# Patient Record
Sex: Male | Born: 2000 | Race: Black or African American | Hispanic: No | Marital: Single | Smoking: Current every day smoker
Health system: Southern US, Community
[De-identification: ages and names within clinical notes are randomized; demographics above are authoritative.]

## PROBLEM LIST (undated history)

## (undated) ENCOUNTER — Emergency Department (HOSPITAL_COMMUNITY): Admission: EM | Payer: Medicaid Other | Source: Home / Self Care

## (undated) DIAGNOSIS — E119 Type 2 diabetes mellitus without complications: Secondary | ICD-10-CM

## (undated) DIAGNOSIS — R569 Unspecified convulsions: Secondary | ICD-10-CM

## (undated) HISTORY — PX: MANDIBLE FRACTURE SURGERY: SHX706

---
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
## 2018-04-17 ENCOUNTER — Encounter (HOSPITAL_COMMUNITY): Payer: Self-pay

## 2018-04-17 ENCOUNTER — Emergency Department (HOSPITAL_COMMUNITY)
Admission: EM | Admit: 2018-04-17 | Discharge: 2018-04-17 | Disposition: A | Discharge status: Home / Self Care | Payer: Medicaid Other | Attending: Emergency Medicine | Admitting: Emergency Medicine

## 2018-04-17 DIAGNOSIS — F17200 Nicotine dependence, unspecified, uncomplicated: Secondary | ICD-10-CM | POA: Insufficient documentation

## 2018-04-17 DIAGNOSIS — Z711 Person with feared health complaint in whom no diagnosis is made: Secondary | ICD-10-CM | POA: Insufficient documentation

## 2018-04-17 NOTE — ED Triage Notes (Signed)
Pt presents for STD check, reports peeling of penis that was 1st noted yesterday.  Pt denies any penile drainage, reports 3-4 partners with recent unprotected sex.

## 2018-04-17 NOTE — ED Provider Notes (Signed)
[facility] EMERGENCY DEPARTMENT Provider Note   CSN: [id number] Arrival date & time: 04/17/18  1417    History   Chief Complaint No chief complaint on file.   HPI Todd Frost is a 18 y.o. male.     HPI Patient is a 18 year old male presents the emergency department with an area of irritation on the shaft of his penis and wants to be checked for STDs.  No penile discharge.  He has had several partners with unprotected sex.  No other complaints at this time.   History reviewed. No pertinent past medical history.  Patient Active Problem List   Diagnosis Date Noted  . Learning disability 04/21/2011  . Delayed milestones 04/21/2011  . 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 No pertinent surgical history.      Home Medications    Prior to Admission medications   Medication Sig Start Date End Date Taking? Authorizing Provider  ibuprofen (ADVIL,MOTRIN) 600 MG tablet Take 1 tablet (600 mg total) by mouth every 6 (six) hours as needed. 12/15/17   Maczis, Elmer Sow, PA-C    Family History History reviewed. No pertinent family history.  Social History Social History   Tobacco Use  . Smoking status: Current Every Day Smoker  . Smokeless tobacco: Never Used  Substance Use Topics  . Alcohol use: No  . Drug use: Not Currently    Types: Marijuana    Comment: sometimes, last used today     Allergies   Patient has no known allergies.   Review of Systems Review of Systems  All other systems reviewed and are negative.    Physical Exam Updated Vital Signs BP 123/71 (BP Location: Right Arm)   Pulse (!) 57   Temp 98.2 F (36.8 C) (Oral)   Resp 16   Ht 6\' 3"  (1.905 m)   Wt 68.5 kg   SpO2 100%   BMI 18.87 kg/m   Physical Exam Vitals signs and nursing note reviewed.  Constitutional:      Appearance: He is well-developed.  HENT:     Head: Normocephalic.  Neck:     Musculoskeletal: Normal range of motion.  Pulmonary:     Effort:  Pulmonary effort is normal.  Abdominal:     General: There is no distension.  Genitourinary:    Comments: Circumcised penis.  Healed abrasions to the shaft of his penis without obvious STD appearance Musculoskeletal: Normal range of motion.  Neurological:     Mental Status: He is alert and oriented to person, place, and time.      ED Treatments / Results  Labs (all labs ordered are listed, but only abnormal results are displayed) Labs Reviewed - No data to display  EKG None  Radiology No results found.  Procedures Procedures (including critical care time)  Medications Ordered in ED Medications - No data to display   Initial Impression / Assessment and Plan / ED Course  I have reviewed the triage vital signs and the nursing notes.  Pertinent labs & imaging results that were available during my care of the patient were reviewed by me and considered in my medical decision making (see chart for details).       Suspect abrasion of the shaft of his penis from intercourse and or masturbation.  No indication for additional testing here in the emergency department.  He will need a full STD panel and evaluation at the [facility] department.  He is been given contact information for  them.   Final Clinical Impressions(s) / ED Diagnoses   Final diagnoses:  Concern about STD in male without diagnosis    ED Discharge Orders    None       Azalia Bilis, MD 04/17/18 [phone]

## 2018-04-17 NOTE — ED Notes (Signed)
Patient verbalizes understanding of discharge instructions. Opportunity for questioning and answers were provided. Armband removed by staff, pt discharged from ED home via POV.  

## 2018-04-17 NOTE — ED Notes (Signed)
Pt here for an STD check. Pt reports a scab on his penis and discharge.

## 2018-04-17 NOTE — ED Notes (Signed)
Pt was not happy that physician was not going to give him a shot like he's had before and was going to refer him to the [facility]. Pt left without paperwork and signature.

## 2018-06-01 ENCOUNTER — Other Ambulatory Visit: Payer: Self-pay

## 2018-06-01 ENCOUNTER — Emergency Department (HOSPITAL_COMMUNITY)
Admission: EM | Admit: 2018-06-01 | Discharge: 2018-06-01 | Disposition: A | Discharge status: Home / Self Care | Payer: Medicaid Other | Source: Home / Self Care | Attending: Emergency Medicine | Admitting: Emergency Medicine

## 2018-06-01 ENCOUNTER — Emergency Department (HOSPITAL_COMMUNITY): Payer: Medicaid Other

## 2018-06-01 ENCOUNTER — Encounter (HOSPITAL_COMMUNITY): Payer: Self-pay | Admitting: *Deleted

## 2018-06-01 ENCOUNTER — Emergency Department (HOSPITAL_COMMUNITY)
Admission: EM | Admit: 2018-06-01 | Discharge: 2018-06-01 | Disposition: A | Discharge status: Home / Self Care | Payer: Medicaid Other | Attending: Emergency Medicine | Admitting: Emergency Medicine

## 2018-06-01 DIAGNOSIS — Y999 Unspecified external cause status: Secondary | ICD-10-CM | POA: Insufficient documentation

## 2018-06-01 DIAGNOSIS — R4020 Unspecified coma: Secondary | ICD-10-CM | POA: Insufficient documentation

## 2018-06-01 DIAGNOSIS — F17210 Nicotine dependence, cigarettes, uncomplicated: Secondary | ICD-10-CM | POA: Insufficient documentation

## 2018-06-01 DIAGNOSIS — R569 Unspecified convulsions: Secondary | ICD-10-CM

## 2018-06-01 DIAGNOSIS — Y9389 Activity, other specified: Secondary | ICD-10-CM | POA: Insufficient documentation

## 2018-06-01 DIAGNOSIS — E119 Type 2 diabetes mellitus without complications: Secondary | ICD-10-CM | POA: Insufficient documentation

## 2018-06-01 DIAGNOSIS — Y92009 Unspecified place in unspecified non-institutional (private) residence as the place of occurrence of the external cause: Secondary | ICD-10-CM | POA: Insufficient documentation

## 2018-06-01 DIAGNOSIS — S02602A Fracture of unspecified part of body of left mandible, initial encounter for closed fracture: Secondary | ICD-10-CM | POA: Insufficient documentation

## 2018-06-01 DIAGNOSIS — S02609D Fracture of mandible, unspecified, subsequent encounter for fracture with routine healing: Secondary | ICD-10-CM

## 2018-06-01 DIAGNOSIS — W01198A Fall on same level from slipping, tripping and stumbling with subsequent striking against other object, initial encounter: Secondary | ICD-10-CM

## 2018-06-01 DIAGNOSIS — Z79899 Other long term (current) drug therapy: Secondary | ICD-10-CM

## 2018-06-01 DIAGNOSIS — R220 Localized swelling, mass and lump, head: Secondary | ICD-10-CM | POA: Diagnosis present

## 2018-06-01 HISTORY — DX: Type 2 diabetes mellitus without complications: E11.9

## 2018-06-01 HISTORY — DX: Unspecified convulsions: R56.9

## 2018-06-01 LAB — CBC WITH DIFFERENTIAL/PLATELET
Abs Immature Granulocytes: 0 K/uL (ref 0.00–0.07)
Basophils Absolute: 0 K/uL (ref 0.0–0.1)
Basophils Relative: 1 %
Eosinophils Absolute: 0.1 K/uL (ref 0.0–0.5)
Eosinophils Relative: 2 %
HCT: 43.1 % (ref 39.0–52.0)
Hemoglobin: 14.6 g/dL (ref 13.0–17.0)
Immature Granulocytes: 0 %
Lymphocytes Relative: 37 %
Lymphs Abs: 1.6 K/uL (ref 0.7–4.0)
MCH: 31.7 pg (ref 26.0–34.0)
MCHC: 33.9 g/dL (ref 30.0–36.0)
MCV: 93.5 fL (ref 80.0–100.0)
Monocytes Absolute: 0.3 K/uL (ref 0.1–1.0)
Monocytes Relative: 6 %
Neutro Abs: 2.4 K/uL (ref 1.7–7.7)
Neutrophils Relative %: 54 %
Platelets: 221 K/uL (ref 150–400)
RBC: 4.61 MIL/uL (ref 4.22–5.81)
RDW: 13.3 % (ref 11.5–15.5)
WBC: 4.4 K/uL (ref 4.0–10.5)
nRBC: 0 % (ref 0.0–0.2)

## 2018-06-01 LAB — VALPROIC ACID LEVEL: Valproic Acid Lvl: <10 ug/mL — ABNORMAL LOW (ref 50.0–100.0)

## 2018-06-01 LAB — BASIC METABOLIC PANEL
Anion gap: 9 (ref 5–15)
BUN: <5 mg/dL — ABNORMAL LOW (ref 6–20)
CO2: 22 mmol/L (ref 22–32)
Calcium: 9 mg/dL (ref 8.9–10.3)
Chloride: 103 mmol/L (ref 98–111)
Creatinine, Ser: 0.86 mg/dL (ref 0.61–1.24)
GFR calc Af Amer: >60 mL/min (ref >60)
GFR calc non Af Amer: >60 mL/min (ref >60)
Glucose, Bld: 129 mg/dL — ABNORMAL HIGH (ref 70–99)
Potassium: 3.4 mmol/L — ABNORMAL LOW (ref 3.5–5.1)
Sodium: 134 mmol/L — ABNORMAL LOW (ref 135–145)

## 2018-06-01 LAB — MAGNESIUM: Magnesium: 1.9 mg/dL (ref 1.7–2.4)

## 2018-06-01 LAB — HEPATIC FUNCTION PANEL
ALT: 23 U/L (ref 0–44)
AST: 33 U/L (ref 15–41)
Albumin: 3.8 g/dL (ref 3.5–5.0)
Alkaline Phosphatase: 111 U/L (ref 38–126)
Bilirubin, Direct: 0.2 mg/dL (ref 0.0–0.2)
Indirect Bilirubin: 0.9 mg/dL (ref 0.3–0.9)
Total Bilirubin: 1.1 mg/dL (ref 0.3–1.2)
Total Protein: 7.3 g/dL (ref 6.5–8.1)

## 2018-06-01 MED ORDER — MORPHINE SULFATE (PF) 4 MG/ML IV SOLN
4.0000 mg | Freq: Once | INTRAVENOUS | Status: AC
  Administered 2018-06-01: 05:00:00 4 mg via INTRAVENOUS
  Filled 2018-06-01: qty 1

## 2018-06-01 MED ORDER — DIVALPROEX SODIUM 125 MG PO CSDR: 125.0000 mg | DELAYED_RELEASE_CAPSULE | Freq: Four times a day (QID) | ORAL | 0 refills | Status: AC

## 2018-06-01 MED ORDER — MORPHINE SULFATE (PF) 4 MG/ML IV SOLN
4.0000 mg | Freq: Once | INTRAVENOUS | Status: AC
  Administered 2018-06-01: 4 mg via INTRAVENOUS
  Filled 2018-06-01: qty 1

## 2018-06-01 MED ORDER — KETOROLAC TROMETHAMINE 30 MG/ML IJ SOLN
15.0000 mg | Freq: Once | INTRAMUSCULAR | Status: AC
  Administered 2018-06-01: 15 mg via INTRAVENOUS
  Filled 2018-06-01: qty 1

## 2018-06-01 MED ORDER — VALPROATE SODIUM 500 MG/5ML IV SOLN
750.0000 mg | Freq: Once | INTRAVENOUS | Status: AC
  Administered 2018-06-01: 08:00:00 750 mg via INTRAVENOUS
  Filled 2018-06-01: qty 7.5
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
## 2018-06-01 NOTE — ED Provider Notes (Signed)
[facility]-EMERGENCY DEPT Provider Note   CSN: [id number] Arrival date & time: 06/01/18  0715    History   Chief Complaint Chief Complaint  Patient presents with   Seizures   Facial Injury   Dizziness    HPI Todd Frost is a 18 y.o. male.     Todd Frost is a 18 y.o. male with a history of seizures, and recent ORIF of the mandible, who presents this morning after reported seizure a little after 6 AM.  Patient was seen at [facility] emergency department overnight after patient had a syncopal episode with no witnessed seizure activity at that time where he fell and hit his jaw.  He reports that after returning home from the hospital his brother witnessed him have a seizure while he fell to the ground from sitting, hitting his jaw and head once again.  He has a known history of seizures and reports he has not been taking his Depakote, reports he can only take in liquids with his jaw wired shut and has been unable to take any pills.  Reports he last took this medication may be in January, and cannot remember the dose.  Patient unsure how long the seizure lasted but is now alert, oriented and has had no further seizure-like activity.  He is complaining of pain over his jaw, he has not taken anything for pain prior to arrival, denies any headache, vision changes, dizziness, numbness weakness or tingling.  Patient reports that he has only been taking in juice primarily for nutrition, has not been taking in much food for nutrition and reports he intermittently feels lightheaded.  Has follow-up with his oral surgeon on 4/12.  No other new or concerning symptoms, no fevers, cough, shortness of breath.  During recent ED visit this morning patient had a CT scan of the jaw which showed no new changes or fractures from fall, BMP was checked which showed no significant electrolyte derangements.  Patient was given dose of pain medication and discharged home.      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 Authorizing Provider  divalproex (DEPAKOTE SPRINKLES) 125 MG capsule Take 1 capsule (125 mg total) by mouth 4 (four) times daily for 30 days. 06/01/18 07/01/18  Dartha Lodge, PA-C  ibuprofen (ADVIL,MOTRIN) 600 MG tablet Take 1 tablet (600 mg total) by mouth every 6 (six) hours as needed. Patient not taking: Reported on 06/01/2018 12/15/17   Maczis, Elmer Sow, PA-C    Family History No family history on file.  Social History Social History   Tobacco Use   Smoking status: Current Every Day Smoker   Smokeless tobacco: Never Used  Substance Use Topics   Alcohol use: No   Drug use: Not Currently    Types: Marijuana    Comment: sometimes, last used today     Allergies   Penicillin g   Review of Systems Review of Systems  Constitutional: Negative for chills and fever.  HENT: Negative.   Eyes: Negative for visual disturbance.  Respiratory: Negative for cough and shortness of breath.   Cardiovascular: Negative for chest pain.  Gastrointestinal: Negative for abdominal pain, nausea and vomiting.  Musculoskeletal: Negative for arthralgias, myalgias and neck pain.  Skin: Negative for color change and rash.  Neurological: Positive for seizures and light-headedness. Negative for dizziness, syncope, weakness, numbness and headaches.  All other systems reviewed and are negative.    Physical Exam Updated Vital Signs BP 121/83    Pulse 61    Temp 97.9 F (36.6 C) (Oral)    Resp 14    Ht  (1.702 m)    Wt 54.4 kg    SpO2 100%    BMI 18.79 kg/m    Physical Exam Vitals signs and nursing note reviewed.  Constitutional:      General: He is not in acute distress.    Appearance: Normal appearance. He is well-developed and normal weight. He is not ill-appearing, toxic-appearing or diaphoretic.  HENT:     Head: Normocephalic and atraumatic.     Comments: Scalp without signs of trauma, no palpable hematoma, no step-off, negative battle sign    Nose: Nose normal.     Mouth/Throat:     Mouth: Mucous membranes are moist.     Pharynx: Oropharynx is clear.     Comments: Jaw is wired shut, slight swelling over left cheek, but no palpable deformity, intraoral exam shows wires are in place, no obvious defect or displacement Eyes:     General:        Right eye: No discharge.        Left eye: No discharge.     Extraocular Movements: Extraocular movements intact.     Pupils: Pupils are equal, round, and reactive to light.  Neck:     Musculoskeletal: Normal range of motion and neck supple.     Comments: C-spine NTTP Cardiovascular:     Rate and Rhythm: Normal rate and regular rhythm.     Heart sounds: Normal heart sounds. No murmur. No friction rub. No gallop.   Pulmonary:     Effort: Pulmonary effort is normal. No respiratory distress.     Breath sounds: Normal breath sounds. No wheezing or rales.     Comments: Respirations equal and unlabored, patient able to speak in full sentences, lungs clear to auscultation bilaterally Abdominal:     General: Bowel sounds are normal. There is no distension.     Palpations: Abdomen is soft. There is no mass.     Tenderness: There is no abdominal tenderness. There is no guarding.     Comments: Abdomen soft, nondistended, nontender to palpation in all quadrants without guarding or peritoneal signs  Musculoskeletal:        General: No deformity.     Right lower leg: No edema.     Left lower leg: No edema.  Skin:    General: Skin is warm and dry.     Capillary Refill: Capillary refill takes less than  2 seconds.  Neurological:     Mental Status: He is alert and oriented to person, place, and time.     Coordination: Coordination normal.     Comments: Speech is clear, able to follow commands CN III-XII intact Normal strength in upper and lower extremities bilaterally including dorsiflexion and plantar flexion, strong and equal grip strength Sensation normal to light and sharp touch Moves extremities without ataxia, coordination intact   Psychiatric:        Mood and Affect: Mood normal.  Behavior: Behavior normal.      ED Treatments / Results  Labs (all labs ordered are listed, but only abnormal results are displayed) Labs Reviewed  VALPROIC ACID LEVEL - Abnormal; Notable for the following components:      Result Value   Valproic Acid Lvl <10 (*)    All other components within normal limits  CBC WITH DIFFERENTIAL/PLATELET  MAGNESIUM  HEPATIC FUNCTION PANEL    EKG EKG Interpretation  Date/Time:  Sunday June 01 2018 07:47:46 EDT Ventricular Rate:  58 PR Interval:    QRS Duration: 95 QT Interval:  429 QTC Calculation: 422 R Axis:   92 Text Interpretation:  Sinus rhythm Borderline right axis deviation ST elevation  cw early repolarization.   Confirmed by Tilden Fossa [phone]) on 06/01/2018 7:50:30 AM Also confirmed by Tilden Fossa [phone]), editor Barbette Hair [phone])  on 06/01/2018 7:57:31 AM   Radiology Dg Mandible 4 Views  Result Date: 06/01/2018 CLINICAL DATA:  Fall earlier this morning. Recent surgical fixation for mandible fractures. EXAM: MANDIBLE - 4+ VIEW COMPARISON:  CT of the maxillofacial region, 06/01/2018 at 3:52 a.m. FINDINGS: Fractures of the anterior mandible and left mandibular angle are unchanged from the CT. Wire fixation is unchanged. No new fractures.  No bone lesions. IMPRESSION: 1. No change in the mandibular fractures when compared to the maxillofacial CT obtained earlier today. No new fractures. Electronically Signed   By: Amie Portland M.D.    On: 06/01/2018 09:44   Ct Maxillofacial Wo Contrast  Result Date: 06/01/2018 CLINICAL DATA:  Facial trauma. Prior mandibular fracture. EXAM: CT MAXILLOFACIAL WITHOUT CONTRAST TECHNIQUE: Multidetector CT imaging of the maxillofacial structures was performed. Multiplanar CT image reconstructions were also generated. COMPARISON:  05/16/2018 FINDINGS: Osseous: --Complex facial fracture types: No LeFort, zygomaticomaxillary complex or nasoorbitoethmoidal fracture. --Simple fracture types: There is a minimally displaced fracture of the lateral limb of the left pterygoid plate, unchanged. --Mandible, hard palate and teeth: Wire fixation mandibular fracture involving the left mandibular angle and the right parasymphyseal mandible. No new fracture. No focal soft tissue abnormality. Orbits: The globes are intact. Normal appearance of the intra- and extraconal fat. Symmetric extraocular muscles. Sinuses: No fluid levels or advanced mucosal thickening. Soft tissues: Normal visualized extracranial soft tissues. Limited intracranial: Normal. IMPRESSION: 1. Wire fixation of mandibular fracture in anatomic alignment without acute abnormality. 2. Unchanged appearance of minimally displaced fracture of the lateral limb of the left pterygoid plate. 3. No new fracture. Electronically Signed   By: Deatra Robinson M.D.   On: 06/01/2018 04:17    Procedures Procedures (including critical care time)  Medications Ordered in ED Medications  valproate (DEPACON) 750 mg in dextrose 5 % 50 mL IVPB (0 mg Intravenous Stopped 06/01/18 1035)  ketorolac (TORADOL) 30 MG/ML injection 15 mg (15 mg Intravenous Given 06/01/18 0811)     Initial Impression / Assessment and Plan / ED Course  I have reviewed the triage vital signs and the nursing notes.  Pertinent labs & imaging results that were available during my care of the patient were reviewed by me and considered in my medical decision making (see chart for details).  Patient presents to  the emergency department after witnessed seizure, he reports his brother saw him fall from sitting to have a seizure-like episode, known history of seizures but has not taken his Depakote in multiple months.  Patient had recent ORIF of the mandible and jaw is wired shut he is complaining of pain over his jaw, of note patient was  seen in the ED earlier this morning for possible syncopal episode with fall where he hit his jaw and had normal CT at this time which showed postsurgical hardware in place with no new fracture or injury, he reports after returning home he started to play video games and had a seizure.  On arrival patient does not appear to be postictal, he has normal neurologic exam and no signs of significant trauma there is mild swelling over the left jaw but wiring appears to be intact without obvious palpable deformity.  Patient was recently seen and had normal BMP, will add on hepatic panel, CBC and mag, as well as Depakote level.  Given that patient has no significant signs of trauma and just had a CT max face I would like to avoid unnecessary radiation, will start with plain films of the mandible to ensure that surgical hardware is in place with no signs of new displacement or fracture.  Will give loading dose of Depakote here in the emergency department, given patient just recently received morphine for his jaw pain will give 1 dose of Toradol here.  BMP from this morning showed no significant electrolyte derangements and hepatic panel shows normal LFTs, patient with normal blood counts, normal mag, Depakote level low as expected as patient has been noncompliant.  Mandible films reviewed by myself and read by radiology, wire fixation noted with anatomic alignment, no acute abnormality noted, unchanged appearance of minimally displaced fracture of the lateral limb of the left pterygoid plate.  Discussed reassuring mandible films with the patient, he has been given something to drink and soup to  eat here in the emergency department and is tolerating this well has had no further seizures here in the emergency department.  I discussed appropriate Depakote dosing with pharmacy as patient has been off this for multiple months at this point and they recommend starting with 500 mg total daily dosing, will prescribe patient with Depakote sprinkles which he can use in applesauce or smoothie that can be drink through a straw, will dose this 125 mg 4 times daily, I have given patient specific instructions regarding this and he expresses understanding and agreement with plan.  Patient to follow-up with neurology, seizure precautions provided.  I have also asked the patient to call and discuss recent falls and nutrition with his oral surgeon.  At this time patient is stable for discharge home in good condition.  He expresses understanding and agreement with plan.  Patient discussed with Dr. Madilyn Hook, who saw patient as well and agrees with plan.   Final Clinical Impressions(s) / ED Diagnoses   Final diagnoses:  Seizure-like activity (HCC)  Closed fracture of mandible with routine healing, unspecified laterality, unspecified mandibular site, subsequent encounter    ED Discharge Orders         Ordered    divalproex (DEPAKOTE SPRINKLES) 125 MG capsule  4 times daily     06/01/18 1044           Jodi Geralds [place], [place] 06/03/18 1115    Tilden Fossa, MD 06/03/18 [phone]

## 2018-06-01 NOTE — ED Notes (Signed)
Patient was given soup and something to drink. Patient tolerated it well.

## 2018-06-01 NOTE — Discharge Instructions (Signed)
Your seen today for facial swelling.  Your CT scan shows good alignment of your wires and no additional fracture.  Apply ice.  Take ibuprofen or Tylenol for pain.  You can take liquid ibuprofen or Tylenol.  Regarding your loss of consciousness, the cause is unclear.  Resume taking your seizure medications and follow-up with your neurologist.  Do not drive or operate heavy machinery until cleared by your neurologist.

## 2018-06-01 NOTE — Discharge Instructions (Addendum)
It is very important you start taking your seizure medicine once again, I have prescribed a Depakote sprinkles which you should be able to mix in a thickened liquid like a smoothie or applesauce and take even with your jaw wired shut.  You need to take this 4 times daily, break the capsule open and pour the beads into the liquid, do not crush or break the beads.  Make sure you are increasing your p.o. intake you can do things like soups or broth, smoothies, you can add in protein powder to increase your nutrition even though you are on a liquid diet.  You will need to follow-up with neurology regarding seizures for further management of your Depakote dosing.  No driving until you are seen by neurology, you should not swim or do anything That could potentially be life-threatening if you were to began to have a seizure during this.    Call to schedule follow-up appointment with Dr. Barbette Merino you will need discussed with him for any further pain management, in the meantime you can use liquid Tylenol and ibuprofen.  You can also use ice.
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
## 2018-06-01 NOTE — ED Notes (Signed)
Patient transported to CT 

## 2018-06-01 NOTE — ED Notes (Signed)
Patient refused to apply ice to affected area at this time.
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
## 2018-07-01 ENCOUNTER — Other Ambulatory Visit: Payer: Self-pay

## 2018-07-01 ENCOUNTER — Emergency Department (HOSPITAL_COMMUNITY)
Admission: EM | Admit: 2018-07-01 | Discharge: 2018-07-01 | Discharge status: Against Medical Advice | Payer: Medicaid Other | Attending: Emergency Medicine | Admitting: Emergency Medicine

## 2018-07-01 DIAGNOSIS — Z5321 Procedure and treatment not carried out due to patient leaving prior to being seen by health care provider: Secondary | ICD-10-CM | POA: Diagnosis not present

## 2018-07-01 DIAGNOSIS — Z202 Contact with and (suspected) exposure to infections with a predominantly sexual mode of transmission: Secondary | ICD-10-CM | POA: Diagnosis present

## 2018-08-24 ENCOUNTER — Encounter (HOSPITAL_COMMUNITY): Payer: Self-pay | Admitting: Emergency Medicine

## 2018-08-24 ENCOUNTER — Other Ambulatory Visit: Payer: Self-pay

## 2018-08-24 ENCOUNTER — Emergency Department (HOSPITAL_COMMUNITY)
Admission: EM | Admit: 2018-08-24 | Discharge: 2018-08-24 | Disposition: A | Discharge status: Home / Self Care | Payer: Medicaid Other | Attending: Emergency Medicine | Admitting: Emergency Medicine

## 2018-08-24 DIAGNOSIS — Z202 Contact with and (suspected) exposure to infections with a predominantly sexual mode of transmission: Secondary | ICD-10-CM | POA: Insufficient documentation

## 2018-08-24 DIAGNOSIS — R369 Urethral discharge, unspecified: Secondary | ICD-10-CM

## 2018-08-24 LAB — URINALYSIS, ROUTINE W REFLEX MICROSCOPIC
Bilirubin Urine: NEGATIVE
Glucose, UA: NEGATIVE mg/dL
Hgb urine dipstick: NEGATIVE
Ketones, ur: NEGATIVE mg/dL
Leukocytes,Ua: NEGATIVE
Nitrite: NEGATIVE
Protein, ur: NEGATIVE mg/dL
Specific Gravity, Urine: 1.015 (ref 1.005–1.030)
pH: 8 (ref 5.0–8.0)

## 2018-08-24 MED ORDER — STERILE WATER FOR INJECTION IJ SOLN
INTRAMUSCULAR | Status: AC
  Administered 2018-08-24: 10 mL
  Filled 2018-08-24: qty 10

## 2018-08-24 MED ORDER — CEFTRIAXONE SODIUM 250 MG IJ SOLR
250.0000 mg | Freq: Once | INTRAMUSCULAR | Status: AC
  Administered 2018-08-24: 250 mg via INTRAMUSCULAR
  Filled 2018-08-24: qty 250

## 2018-08-24 MED ORDER — AZITHROMYCIN 250 MG PO TABS
1000.0000 mg | ORAL_TABLET | Freq: Once | ORAL | Status: AC
  Administered 2018-08-24: 1000 mg via ORAL
  Filled 2018-08-24: qty 4

## 2018-08-24 NOTE — ED Provider Notes (Signed)
[facility] Provider Note   CSN: [id number] Arrival date & time: 08/24/18  1557    History   Chief Complaint Chief Complaint  Patient presents with  . Exposure to STD    HPI Ocie CornfieldHosea Kingsford is a 18 y.o. male.     18 y.o male with a PMH DN, Seizures presents to the ED requesting STI treatment. Patient partner was tested for gonorrhea and chlamydia, he reports she did not wait 7 days and they had sexual intercourse after two days. He endorses some changes in the color of his penile discharge, reports a dark gray color. He denies any testicular swelling, penile swelling or fevers.   The history is provided by the patient and a significant other.  Exposure to STD    Past Medical History:  Diagnosis Date  . Diabetes mellitus without complication (HCC)   . Seizures [facility](HCC)     Patient Active Problem List   Diagnosis Date Noted  . Learning disability 04/21/2011  . Delayed milestones 04/21/2011  . 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 MANDIBLE FRACTURE SURGERY          Home Medications    Prior to Admission medications   Medication Sig Start Date End Date Taking? Authorizing Provider  divalproex (DEPAKOTE SPRINKLES) 125 MG capsule Take 1 capsule (125 mg total) by mouth 4 (four) times daily for 30 days. 06/01/18 07/01/18  Dartha LodgeFord, Kelsey N, PA-C  ibuprofen (ADVIL,MOTRIN) 600 MG tablet Take 1 tablet (600 mg total) by mouth every 6 (six) hours as needed. Patient not taking: Reported on 06/01/2018 12/15/17   Maczis, Elmer SowMichael M, PA-C    Family History No family history on file.  Social History Social History   Tobacco Use  . Smoking status: Current Every Day Smoker    Types: Cigars  . Smokeless tobacco: Never Used  Substance Use Topics  . Alcohol use: Yes    Comment: social  . Drug use: Yes    Types: Marijuana    Comment: every now and then     Allergies   Penicillin g   Review of  Systems Review of Systems  Constitutional: Negative for fever.  Genitourinary: Positive for discharge. Negative for flank pain, penile pain, penile swelling, scrotal swelling, testicular pain and urgency.     Physical Exam Updated Vital Signs BP 116/63 (BP Location: Right Arm)   Pulse 68   Temp 98.6 F (37 C) (Oral)   Resp 15   SpO2 100%   Physical Exam Vitals signs and nursing note reviewed.  Constitutional:      Appearance: He is well-developed.  HENT:     Head: Normocephalic and atraumatic.  Eyes:     General: No scleral icterus.    Pupils: Pupils are equal, round, and reactive to light.  Neck:     Musculoskeletal: Normal range of motion.  Cardiovascular:     Heart sounds: Normal heart sounds.  Pulmonary:     Effort: Pulmonary effort is normal.     Breath sounds: Normal breath sounds. No wheezing.  Chest:     Chest wall: No tenderness.  Abdominal:     General: Bowel sounds are normal. There is no distension.     Palpations: Abdomen is soft.     Tenderness: There is no abdominal tenderness.  Genitourinary:    Comments: Patient declined exam.  Musculoskeletal:        General: No tenderness or deformity.  Skin:    General: Skin is warm and dry.  Neurological:     Mental Status: He is alert and oriented to person, place, and time.      ED Treatments / Results  Labs (all labs ordered are listed, but only abnormal results are displayed) Labs Reviewed  URINALYSIS, ROUTINE W REFLEX MICROSCOPIC    EKG None  Radiology No results found.  Procedures Procedures (including critical care time)  Medications Ordered in ED Medications - No data to display   Initial Impression / Assessment and Plan / ED Course  I have reviewed the triage vital signs and the nursing notes.  Pertinent labs & imaging results that were available during my care of the patient were reviewed by me and considered in my medical decision making (see chart for details).     Patient  with recurrent STD screening in the emergency department.  Presents with requesting STD screening.  Reports his girlfriend was treated recently for gonorrhea and chlamydia, he has sexual intercourse with her 2 days after her treatment.  He is experiencing some gray penile discharge, he denies any testicular swelling, testicular pain, penile swelling.  Patient did not allow me to perform a GU exam, he refused a penile swab.  We will treat him today for gonorrhea and chlamydia.  Advised to obtain from sexual intercourse for the next 10 days.  Chart reviewed for patient's allergy to penicillin G, he has received Rocephin 3 times in the past without any complications for STD encounter.  We will treat today with 1 g of azithromycin along with 250 of Rocephin IM.  Patient stable for discharge.   Portions of this note were generated with [employer]. Dictation errors may occur despite best attempts at proofreading.    Final Clinical Impressions(s) / ED Diagnoses   Final diagnoses:  STD exposure  Penile discharge    ED Discharge Orders    None       Janeece Fitting, PA-C 08/24/18 [place], [place], DO 08/24/18 2055

## 2018-08-24 NOTE — ED Triage Notes (Signed)
Patient reports exposure to gonorrhea/chlamydia after having unprotected sex with his girlfriend, who told him she tested positive. He denies any symptoms, just wants to get treated.

## 2018-08-24 NOTE — ED Notes (Signed)
Patient verbalizes understanding of discharge instructions . Opportunity for questions and answers were provided . Armband removed by staff ,Pt discharged from ED. W/C  offered at D/C  and Declined W/C at D/C and was escorted to lobby by RN.  

## 2018-08-24 NOTE — Discharge Instructions (Addendum)
Today were treated for gonorrhea and chlamydia, please refrain from sexual intercourse for the next 10 days.  For future sexually transmitted disease screening please go to the [facility], phone number is attached to the chart.

## 2020-04-05 ENCOUNTER — Other Ambulatory Visit: Payer: Self-pay

## 2020-04-05 ENCOUNTER — Emergency Department (HOSPITAL_COMMUNITY)
Admission: EM | Admit: 2020-04-05 | Discharge: 2020-04-05 | Disposition: A | Discharge status: Home / Self Care | Payer: Medicaid Other | Attending: Emergency Medicine | Admitting: Emergency Medicine

## 2020-04-05 ENCOUNTER — Encounter (HOSPITAL_COMMUNITY): Payer: Self-pay | Admitting: *Deleted

## 2020-04-05 DIAGNOSIS — F17290 Nicotine dependence, other tobacco product, uncomplicated: Secondary | ICD-10-CM | POA: Diagnosis not present

## 2020-04-05 DIAGNOSIS — E119 Type 2 diabetes mellitus without complications: Secondary | ICD-10-CM | POA: Diagnosis not present

## 2020-04-05 DIAGNOSIS — Z202 Contact with and (suspected) exposure to infections with a predominantly sexual mode of transmission: Secondary | ICD-10-CM | POA: Insufficient documentation

## 2020-04-05 LAB — URINALYSIS, ROUTINE W REFLEX MICROSCOPIC
Bilirubin Urine: NEGATIVE
Glucose, UA: NEGATIVE mg/dL
Hgb urine dipstick: NEGATIVE
Ketones, ur: NEGATIVE mg/dL
Nitrite: NEGATIVE
Protein, ur: 30 mg/dL — AB
Specific Gravity, Urine: 1.024 (ref 1.005–1.030)
pH: 6 (ref 5.0–8.0)

## 2020-04-05 MED ORDER — METRONIDAZOLE 500 MG PO TABS
2000.0000 mg | ORAL_TABLET | Freq: Once | ORAL | Status: AC
  Administered 2020-04-05: 2000 mg via ORAL
  Filled 2020-04-05: qty 4

## 2020-04-05 NOTE — ED Notes (Signed)
Patient verbalizes understanding of discharge instructions. Opportunity for questioning and answers were provided. Armband removed by staff, pt discharged from ED ambulatory.   

## 2020-04-05 NOTE — ED Provider Notes (Signed)
[facility] EMERGENCY DEPARTMENT Provider Note   CSN: [id number] Arrival date & time: 04/05/20  1625     History Chief Complaint  Patient presents with  . Exposure to STD    Mike Hamre is a 20 y.o. male.  Ameya Kutz is a 20 y.o. male with a history of diabetes and seizures, who presents to the ED for STD exposure.  Patient reports that he was told by one of his partners that she tested positive for trichomonas and so he would like to be tested and treated.  He denies any symptoms.  Has not had any dysuria or penile discharge.  No genital lesions noted.  No testicular swelling or pain.  No abdominal pain.  He has had STDs previously and has been seen in the emergency department multiple times for STD testing.  No other aggravating or alleviating factors.        Past Medical History:  Diagnosis Date  . Diabetes mellitus without complication (HCC)   . Seizures [facility])     Patient Active Problem List   Diagnosis Date Noted  . Learning disability 04/21/2011  . Delayed milestones 04/21/2011  . 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 MANDIBLE FRACTURE SURGERY         History reviewed. No pertinent family history.  Social History   Tobacco Use  . Smoking status: Current Every Day Smoker    Types: Cigars  . Smokeless tobacco: Never Used  Substance Use Topics  . Alcohol use: Yes    Comment: social  . Drug use: Yes    Types: Marijuana    Comment: every now and then    Home Medications Prior to Admission medications   Medication Sig Start Date End Date Taking? Authorizing Provider  divalproex (DEPAKOTE SPRINKLES) 125 MG capsule Take 1 capsule (125 mg total) by mouth 4 (four) times daily for 30 days. 06/01/18 07/01/18  Dartha Lodge, PA-C  ibuprofen (ADVIL,MOTRIN) 600 MG tablet Take 1 tablet (600 mg total) by mouth every 6 (six) hours as needed. Patient not taking: Reported on 06/01/2018 12/15/17   Jacinto Halim, PA-C    Allergies    Penicillin g  Review of Systems   Review of Systems  Constitutional: Negative for chills and fever.  Genitourinary: Negative for dysuria, frequency, penile discharge, penile pain, penile swelling, scrotal swelling and testicular pain.  Skin: Negative for color change and rash.  All other systems reviewed and are negative.   Physical Exam Updated Vital Signs BP 111/71 (BP Location: Left Arm)   Pulse 81   Temp 98.3 F (36.8 C) (Oral)   Resp 16   Ht 6\' 3"  (1.905 m)   Wt 68.5 kg   SpO2 100%   BMI 18.87 kg/m   Physical Exam Vitals and nursing note reviewed. Exam conducted with a chaperone present.  Constitutional:      General: He is not in acute distress.    Appearance: Normal appearance. He is well-developed and well-nourished. He is not ill-appearing or diaphoretic.  HENT:     Head: Normocephalic and atraumatic.  Eyes:     General:        Right eye: No discharge.        Left eye: No discharge.  Pulmonary:     Effort: Pulmonary effort is normal. No respiratory distress.  Abdominal:     General: Bowel sounds are normal. There is no distension.  Palpations: Abdomen is soft. There is no mass.     Tenderness: There is no abdominal tenderness. There is no guarding.  Genitourinary:    Comments: Chaperone present.  No lesions or inguinal lymphadenopathy noted. Penis normal without discharge No testicular tenderness, swelling, or masses Skin:    General: Skin is warm and dry.  Neurological:     Mental Status: He is alert and oriented to person, place, and time.     Coordination: Coordination normal.  Psychiatric:        Mood and Affect: Mood and affect and mood normal.        Behavior: Behavior normal.     ED Results / Procedures / Treatments   Labs (all labs ordered are listed, but only abnormal results are displayed) Labs Reviewed  URINALYSIS, ROUTINE W REFLEX MICROSCOPIC - Abnormal; Notable for the following components:      Result  Value   APPearance HAZY (*)    Protein, ur 30 (*)    Leukocytes,Ua TRACE (*)    Bacteria, UA RARE (*)    All other components within normal limits  GC/CHLAMYDIA PROBE AMP ([place]) NOT AT [facility]    EKG None  Radiology No results found.  Procedures Procedures   Medications Ordered in ED Medications  metroNIDAZOLE (FLAGYL) tablet 2,000 mg (2,000 mg Oral Given 04/05/20 1952)    ED Course  I have reviewed the triage vital signs and the nursing notes.  Pertinent labs & imaging results that were available during my care of the patient were reviewed by me and considered in my medical decision making (see chart for details).    MDM Rules/Calculators/A&P                          Patient presents due to a recent exposure to trichomonas.  Patient is afebrile without abdominal tenderness, abdominal pain or painful bowel movements to indicate prostatitis.  No tenderness to palpation of the testes or epididymis to suggest orchitis or epididymitis.  STD cultures obtained, gonorrhea and chlamydia. Patient declined HIV and syphilis testing.  Patient to be discharged with instructions to follow up with PCP. Discussed importance of using protection when sexually active. Pt understands that they have GC/Chlamydia cultures pending and that they will need to inform all sexual partners if results return positive. Patient has been treated prophylactically for trichomonas, will hold off on additional STD testing as patient is asymptomatic.  We will have him follow-up with the health department for any positive results.  Final Clinical Impression(s) / ED Diagnoses Final diagnoses:  STD exposure    Rx / DC Orders ED Discharge Orders    None       Legrand Rams 04/05/20 2305    Wynetta Fines, MD 04/07/20 1051

## 2020-04-05 NOTE — ED Notes (Signed)
Pt refusing blood draw.

## 2020-04-05 NOTE — Discharge Instructions (Signed)
You were tested today for trich, gonorrhea and chlamydia.  You have already been treated for trichomoniasis today since you have a known exposure to this.  You will be called in the next 2 to 3 days if any results are positive, you can also review negative results online.  If you have positive results you will need to go to your PCP or the health department for treatment.  You should avoid any sexual activity until all of your results are back and you have completed all treatments.  Please make sure you are using protection when sexually active to help prevent further STDs.  You can go to the health department for any future STD testing needs.

## 2020-04-05 NOTE — ED Triage Notes (Signed)
Pt reports exposure to std and wants to be checked. Denies any symptoms.

## 2020-08-19 IMAGING — CR MANDIBLE - 4+ VIEW
4 series · 4 of 4 positions shown · non-contrast
Comparison: CT of the maxillofacial region, 06/01/2018 at [DATE]
a.m.

CLINICAL DATA: Fall earlier this morning. Recent surgical fixation
for mandible fractures.

EXAM:
MANDIBLE - 4+ VIEW

[w mandible pa (1 of 3)]
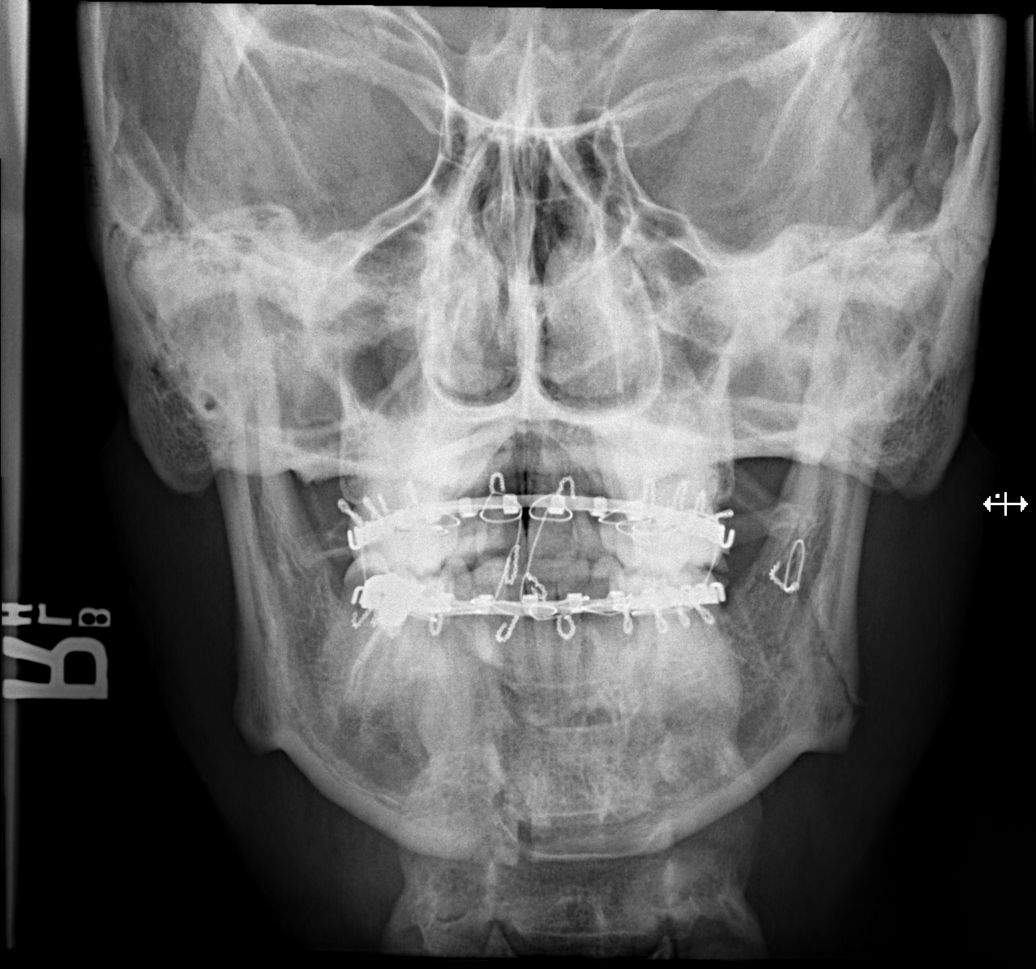

[w mandible pa (2 of 3)]
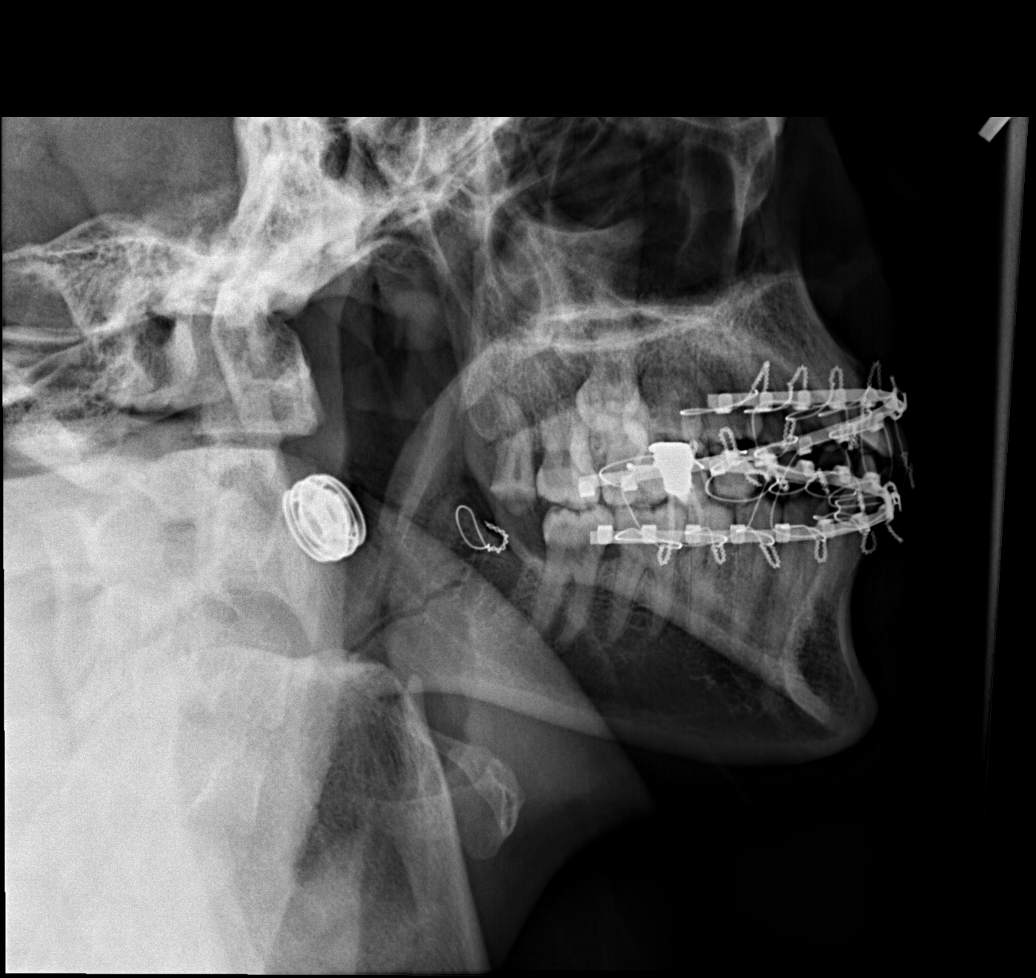

[w mandible obl]
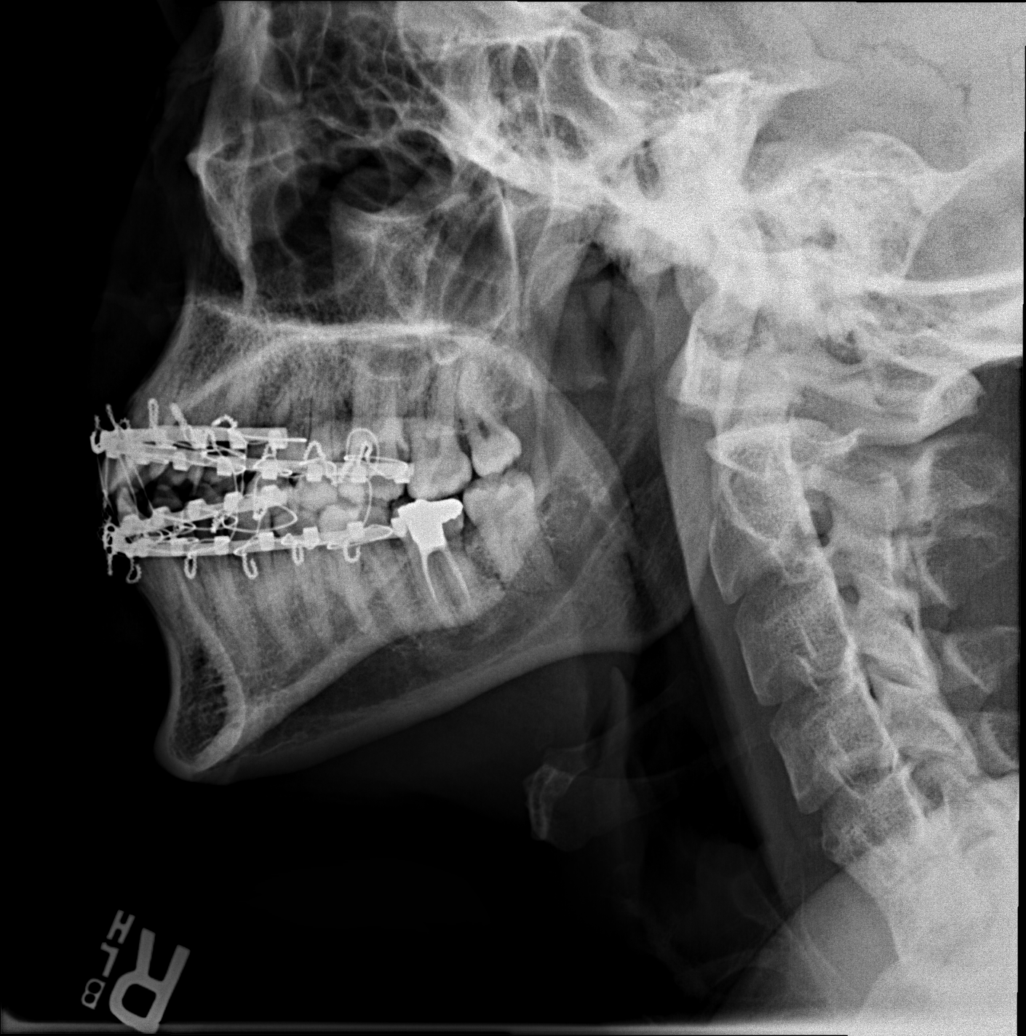

[w mandible pa (3 of 3)]
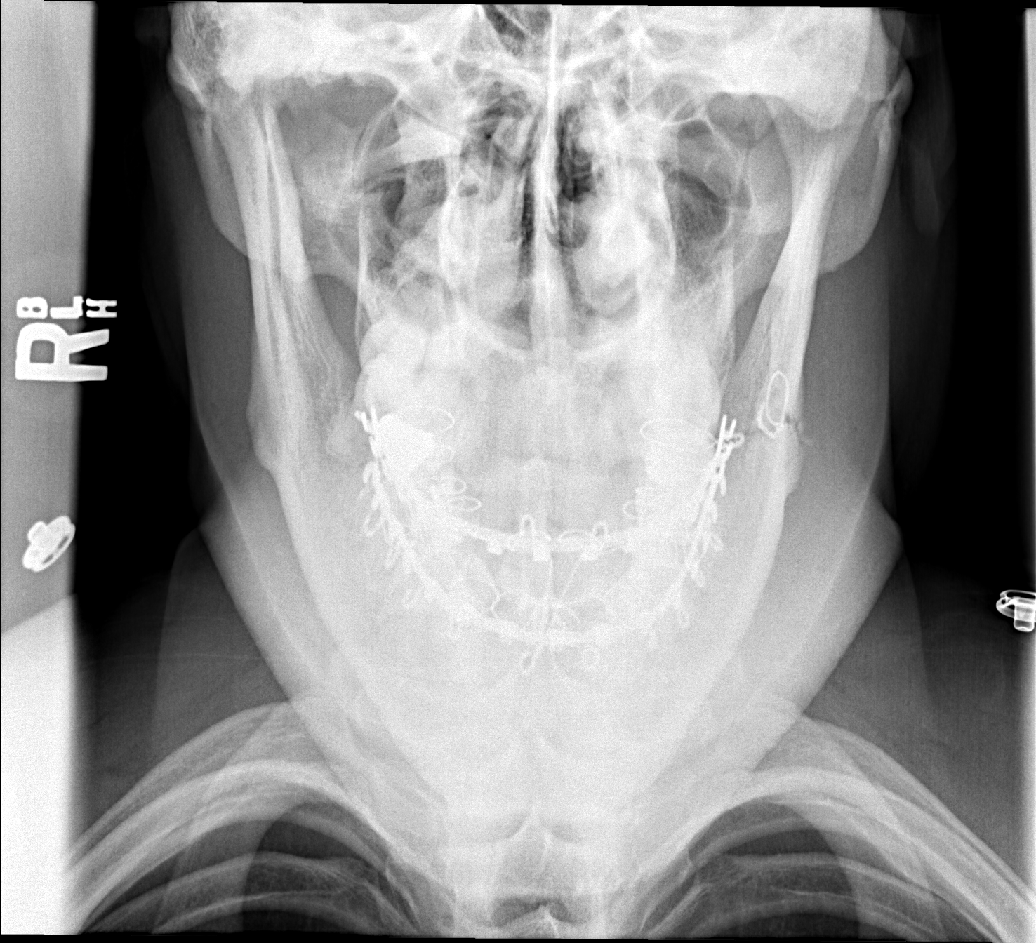

[4 of 4 positions shown; findings below may reference images not displayed]

FINDINGS: Fractures of the anterior mandible and left mandibular angle are
unchanged from the CT. Wire fixation is unchanged.

No new fractures.  No bone lesions.
IMPRESSION: 1. No change in the mandibular fractures when compared to the
maxillofacial CT obtained earlier today. No new fractures.

## 2020-08-19 IMAGING — CT CT MAXILLOFACIAL WITHOUT CONTRAST
3 series · 14 of 47 positions shown, 16 images · non-contrast
Comparison: 05/16/2018

CLINICAL DATA: Facial trauma. Prior mandibular fracture.

EXAM:
CT MAXILLOFACIAL WITHOUT CONTRAST
TECHNIQUE: Multidetector CT imaging of the maxillofacial structures was
performed. Multiplanar CT image reconstructions were also generated.

[Series 3: facialbone 2.0 (person_name) (person_name) · axial · 0.37mm/px · z∈[-208,-48]mm · 8 of 94 slices shown, 10 images]
[im 7/94  brain]
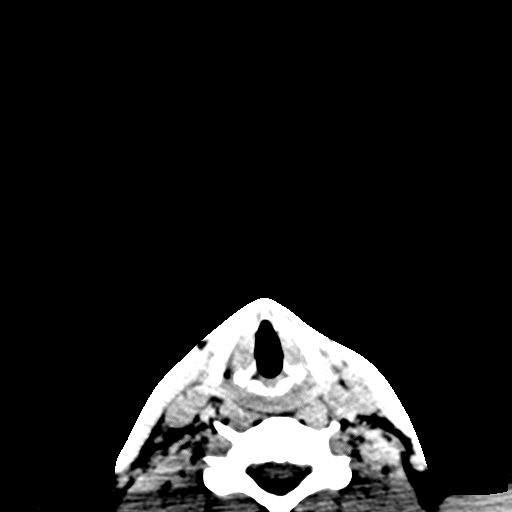
[im 7/94  bone]
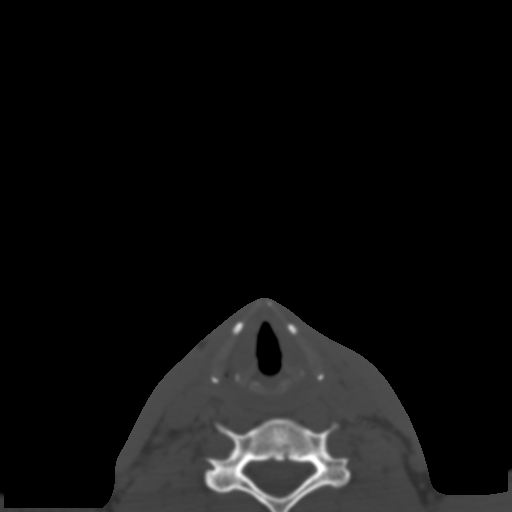
[im 20/94  bone]
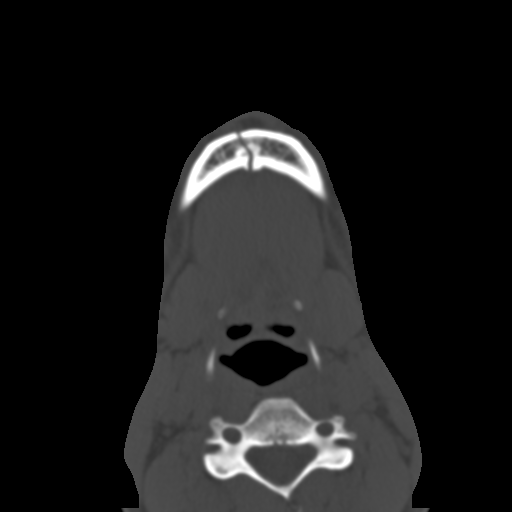
[im 29/94  bone]
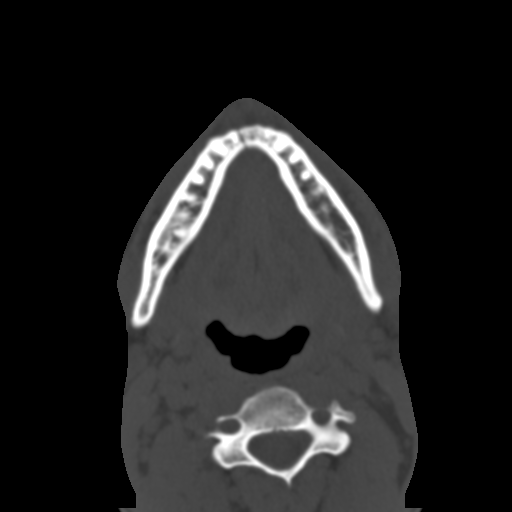
[im 42/94  bone]
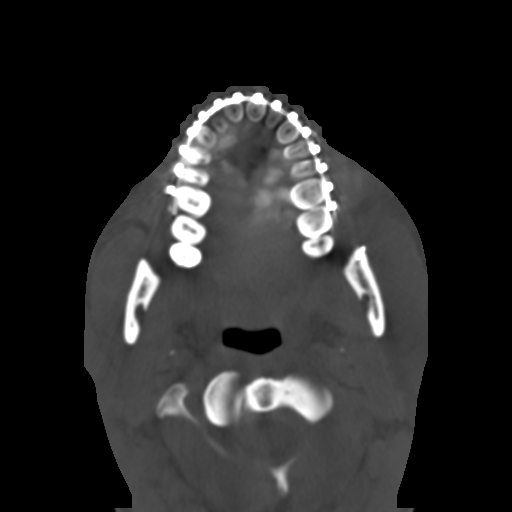
[im 52/94  brain]
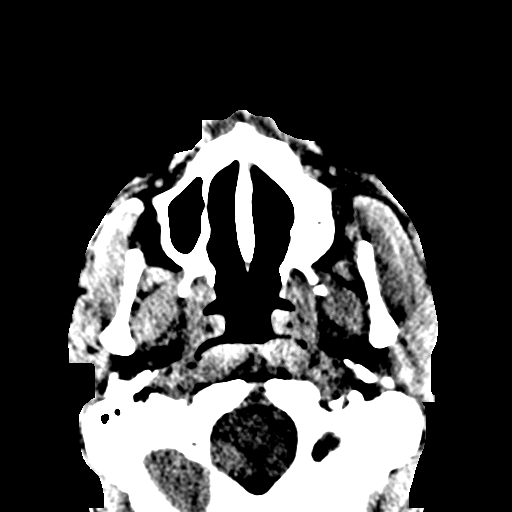
[im 52/94  bone]
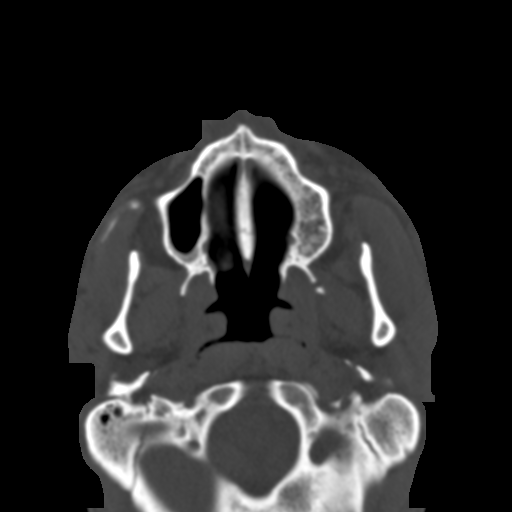
[im 65/94  bone]
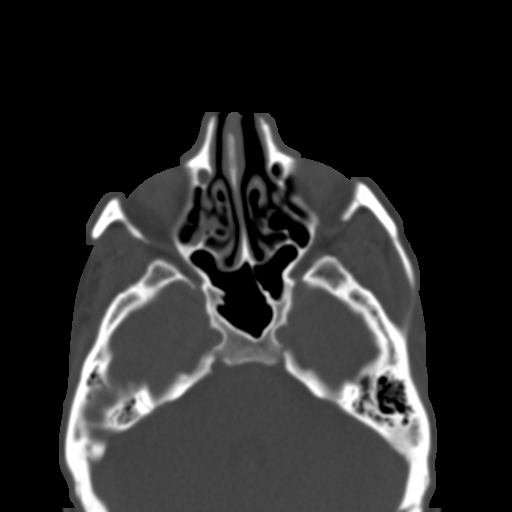
[im 74/94  bone]
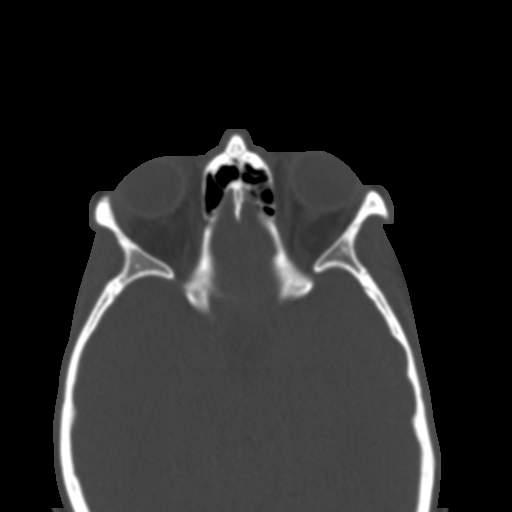
[im 87/94  bone]
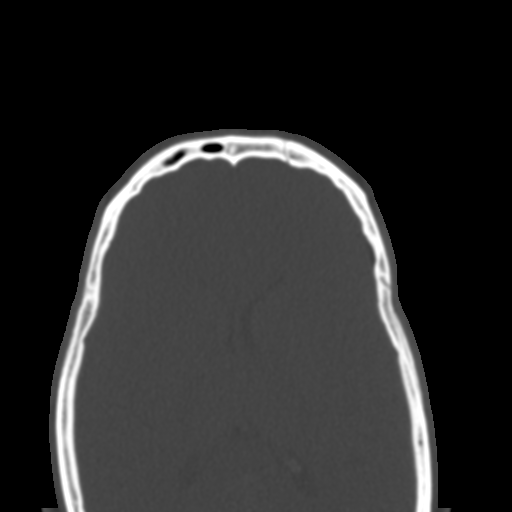

[Series 7: facialbone 2.0 cor st · coronal · 0.36mm/px · 3 of 105 slices shown]
[im 35/105  bone]
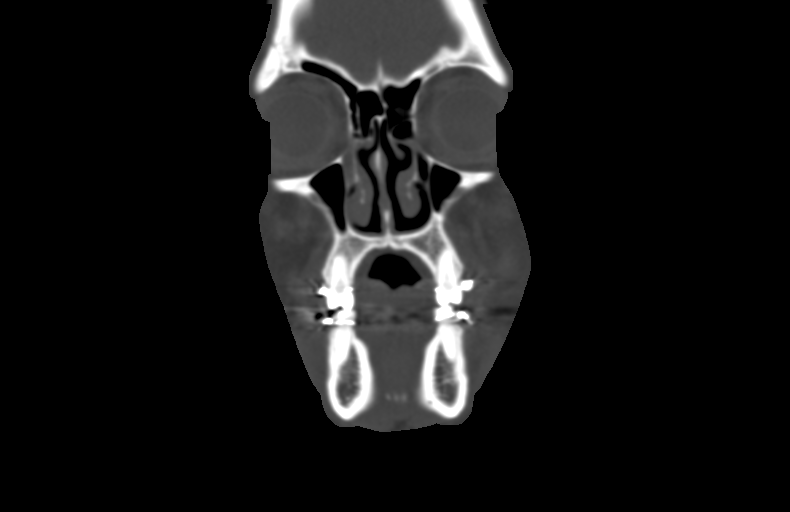
[im 47/105  bone]
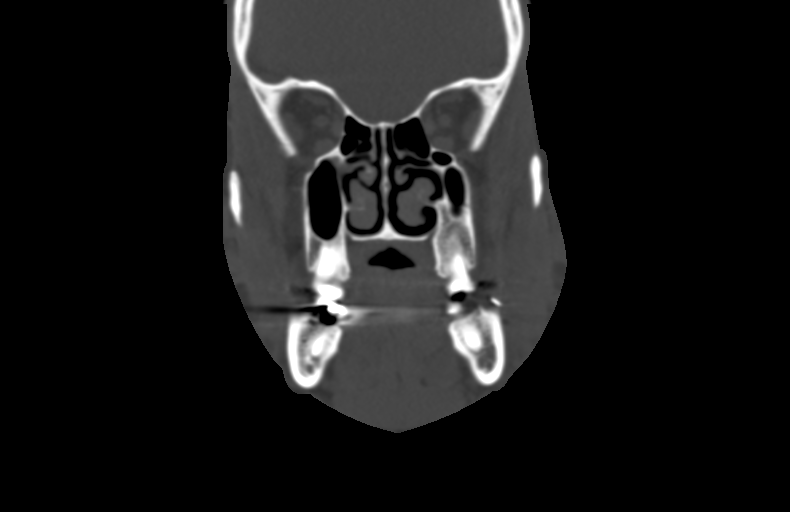
[im 58/105  bone]
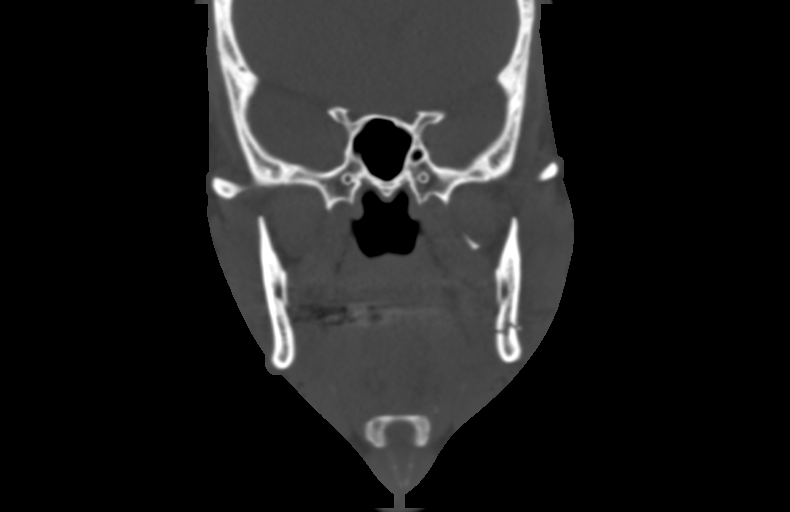

[Series 8: facialbone 2.0 sag st · sagittal · 0.36mm/px · 3 of 89 slices shown]
[im 30/89  bone]
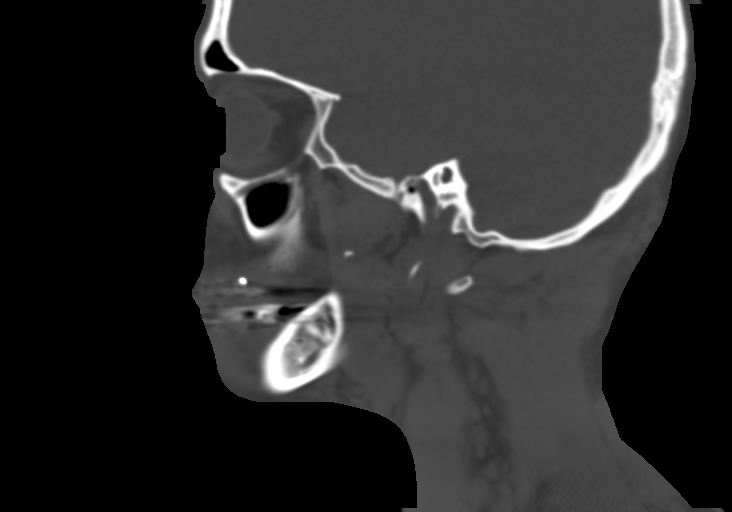
[im 45/89  bone]
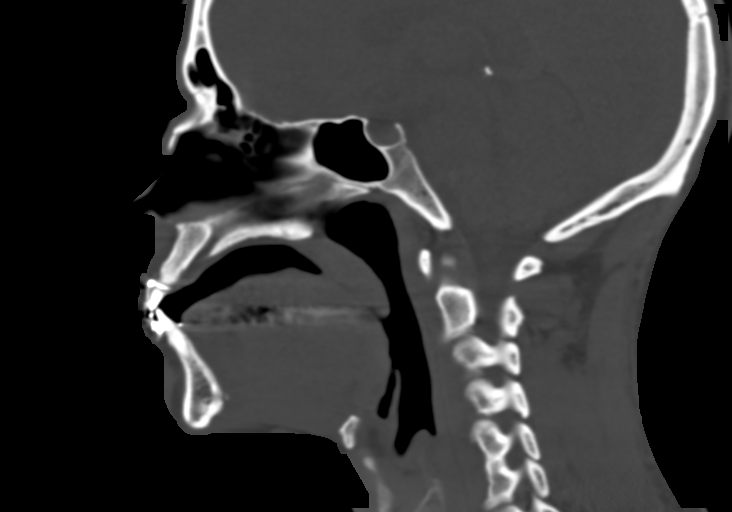
[im 59/89  bone]
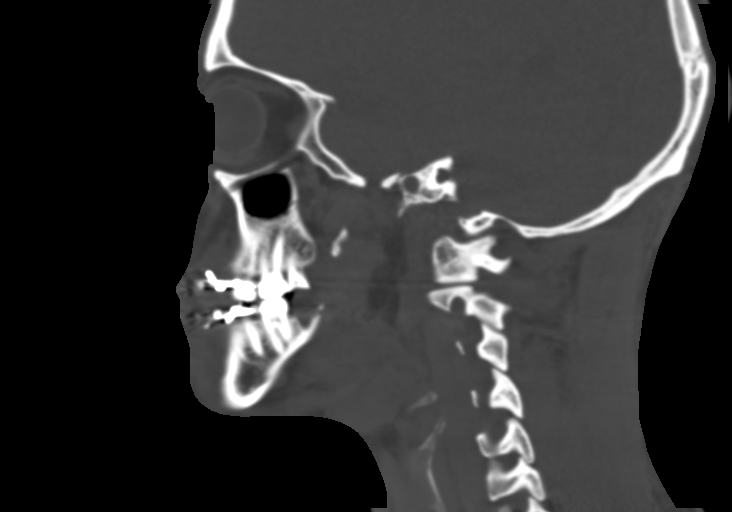

[14 of 47 positions shown; findings below may reference images not displayed]

FINDINGS: Osseous:

--Complex facial fracture types: No [place], zygomaticomaxillary
complex or nasoorbitoethmoidal fracture.

--Simple fracture types: There is a minimally displaced fracture of
the lateral limb of the left pterygoid plate, unchanged.

--Mandible, hard palate and teeth: Wire fixation mandibular fracture
involving the left mandibular angle and the right parasymphyseal
mandible. No new fracture. No focal soft tissue abnormality.

Orbits: The globes are intact. Normal appearance of the intra- and
extraconal fat. Symmetric extraocular muscles.

Sinuses: No fluid levels or advanced mucosal thickening.

Soft tissues: Normal visualized extracranial soft tissues.

Limited intracranial: Normal.
IMPRESSION: 1. Wire fixation of mandibular fracture in anatomic alignment
without acute abnormality.
2. Unchanged appearance of minimally displaced fracture of the
lateral limb of the left pterygoid plate.
3. No new fracture.

## 2021-02-10 ENCOUNTER — Other Ambulatory Visit: Payer: Self-pay | Admitting: Chiropractic Medicine

## 2021-02-10 DIAGNOSIS — M545 Low back pain, unspecified: Secondary | ICD-10-CM

## 2021-03-12 ENCOUNTER — Inpatient Hospital Stay: Admission: RE | Admit: 2021-03-12 | Payer: Medicaid Other | Source: Ambulatory Visit

## 2021-03-12 ENCOUNTER — Other Ambulatory Visit: Payer: Medicaid Other

## 2022-06-19 ENCOUNTER — Encounter (HOSPITAL_BASED_OUTPATIENT_CLINIC_OR_DEPARTMENT_OTHER): Payer: Self-pay | Admitting: Emergency Medicine

## 2022-06-19 ENCOUNTER — Other Ambulatory Visit: Payer: Self-pay

## 2022-06-19 ENCOUNTER — Emergency Department (HOSPITAL_BASED_OUTPATIENT_CLINIC_OR_DEPARTMENT_OTHER)
Admission: EM | Admit: 2022-06-19 | Discharge: 2022-06-19 | Disposition: A | Discharge status: Home / Self Care | Payer: Medicaid Other | Attending: Emergency Medicine | Admitting: Emergency Medicine

## 2022-06-19 DIAGNOSIS — F17290 Nicotine dependence, other tobacco product, uncomplicated: Secondary | ICD-10-CM | POA: Diagnosis not present

## 2022-06-19 DIAGNOSIS — E119 Type 2 diabetes mellitus without complications: Secondary | ICD-10-CM | POA: Insufficient documentation

## 2022-06-19 DIAGNOSIS — Z202 Contact with and (suspected) exposure to infections with a predominantly sexual mode of transmission: Secondary | ICD-10-CM | POA: Diagnosis present

## 2022-06-19 DIAGNOSIS — Z711 Person with feared health complaint in whom no diagnosis is made: Secondary | ICD-10-CM

## 2022-06-19 HISTORY — DX: Type 2 diabetes mellitus without complications: E11.9

## 2022-06-19 LAB — RPR: RPR Ser Ql: NONREACTIVE

## 2022-06-19 LAB — HIV ANTIBODY (ROUTINE TESTING W REFLEX): HIV Screen 4th Generation wRfx: NONREACTIVE

## 2022-06-19 NOTE — ED Notes (Signed)
Pt refused GC/Chlamydia testing. Dr. Read Drivers notified.

## 2022-06-19 NOTE — ED Notes (Signed)
Pt refused swab for GC/Chlamydia by EDP

## 2022-06-19 NOTE — ED Triage Notes (Signed)
Exposure to std, requesting testing

## 2022-06-19 NOTE — ED Provider Notes (Signed)
   [facility] EMERGENCY Provider Note: Todd Dell, MD, FACEP  CSN: [id number] MRN: [id number] ARRIVAL: 06/19/22 at 0014 ROOM: DB013/DB013   CHIEF COMPLAINT  STD Exposure   HISTORY OF PRESENT ILLNESS  06/19/22 2:26 AM Todd Frost is a 22 y.o. male who was told he might have been exposed to herpes.  He is asymptomatic (no genital lesions, urethral discharge or dysuria).  He would like to be tested for herpes.   Past Medical History:  Diagnosis Date   DM (diabetes mellitus)    Seizures     Past Surgical History:  Procedure Laterality Date   MANDIBLE FRACTURE SURGERY      History reviewed. No pertinent family history.  Social History   Tobacco Use   Smoking status: Every Day    Types: Cigars   Smokeless tobacco: Never  Substance Use Topics   Alcohol use: Yes    Comment: social   Drug use: Yes    Types: Marijuana    Comment: every now and then    Prior to Admission medications   Medication Sig Start Date End Date Taking? Authorizing Provider  divalproex (DEPAKOTE SPRINKLES) 125 MG capsule Take 1 capsule (125 mg total) by mouth 4 (four) times daily for 30 days. 06/01/18 07/01/18  Dartha Lodge, PA-C    Allergies Penicillin g   REVIEW OF SYSTEMS  Negative except as noted here or in the History of Present Illness.   PHYSICAL EXAMINATION  Initial Vital Signs Blood pressure 133/83, pulse (!) 122, temperature 98.4 F (36.9 C), temperature source Oral, resp. rate 18, SpO2 99 %.  Examination General: Well-developed, well-nourished male in no acute distress; appearance consistent with age of record HENT: normocephalic; atraumatic Eyes: Normal appearance Neck: supple Heart: regular rate and rhythm Lungs: clear to auscultation bilaterally Abdomen: soft; nondistended; nontender; bowel sounds present GU: Patient refused Extremities: No deformity; full range of motion Neurologic: Awake, alert and oriented; motor function intact in all extremities and symmetric; no  facial droop Skin: Warm and dry Psychiatric: Normal mood and affect   RESULTS  Summary of this visit's results, reviewed and interpreted by myself:   EKG Interpretation  Date/Time:    Ventricular Rate:    PR Interval:    QRS Duration:   QT Interval:    QTC Calculation:   R Axis:     Text Interpretation:         Laboratory Studies: No results found for this or any previous visit (from the past 24 hour(s)). Imaging Studies: No results found.  ED COURSE and MDM  Nursing notes, initial and subsequent vitals signs, including pulse oximetry, reviewed and interpreted by myself.  Vitals:   06/19/22 0032  BP: 133/83  Pulse: (!) 122  Resp: 18  Temp: 98.4 F (36.9 C)  TempSrc: Oral  SpO2: 99%   Medications - No data to display  Patient refused genital examination.  We will test for HIV, RPR and herpes.  PROCEDURES  Procedures   ED DIAGNOSES     ICD-10-CM   1. Concern about STD in male without diagnosis  Z71.1          Todd Frost, Todd Ruiz, MD 06/19/22 [id number]

## 2022-06-20 LAB — HSV 1 ANTIBODY, IGG: HSV 1 Glycoprotein G Ab, IgG: <0.91 index (ref 0.00–0.90)

## 2022-06-20 LAB — HSV 2 ANTIBODY, IGG: HSV 2 Glycoprotein G Ab, IgG: 8.61 index — ABNORMAL HIGH (ref 0.00–0.90)

## 2022-08-18 ENCOUNTER — Emergency Department (HOSPITAL_COMMUNITY)
Admission: EM | Admit: 2022-08-18 | Discharge: 2022-08-18 | Discharge status: Against Medical Advice | Payer: Medicaid Other | Attending: Emergency Medicine | Admitting: Emergency Medicine

## 2022-08-18 DIAGNOSIS — R5383 Other fatigue: Secondary | ICD-10-CM | POA: Insufficient documentation

## 2022-08-18 DIAGNOSIS — Z5321 Procedure and treatment not carried out due to patient leaving prior to being seen by health care provider: Secondary | ICD-10-CM | POA: Insufficient documentation

## 2022-08-18 NOTE — ED Notes (Signed)
Pt taken to room by EMS. Pt immediately came to nurse desk and requesting for family to be brought back. Rn explained that family would come back to room. Charge RN called front desk. Family up front has small children and unable to come back due to age restrictions. Pt then chewed off armband, threw labels and trash and requested to be shown the waiting room.

## 2022-08-18 NOTE — ED Triage Notes (Signed)
Pt arrived via EMS, from [facility]. Pt was refusing to leave center and [employer] was called.  Pt stormed off before triage could be completed, stating he was leaving.

## 2023-01-03 ENCOUNTER — Emergency Department (HOSPITAL_BASED_OUTPATIENT_CLINIC_OR_DEPARTMENT_OTHER)
Admission: EM | Admit: 2023-01-03 | Discharge: 2023-01-03 | Disposition: A | Discharge status: Home / Self Care | Payer: Medicaid Other | Attending: Emergency Medicine | Admitting: Emergency Medicine

## 2023-01-03 ENCOUNTER — Other Ambulatory Visit: Payer: Self-pay

## 2023-01-03 ENCOUNTER — Encounter (HOSPITAL_BASED_OUTPATIENT_CLINIC_OR_DEPARTMENT_OTHER): Payer: Self-pay | Admitting: Emergency Medicine

## 2023-01-03 DIAGNOSIS — Z202 Contact with and (suspected) exposure to infections with a predominantly sexual mode of transmission: Secondary | ICD-10-CM | POA: Insufficient documentation

## 2023-01-03 LAB — URINALYSIS, ROUTINE W REFLEX MICROSCOPIC
Bacteria, UA: NONE SEEN
Bilirubin Urine: NEGATIVE
Glucose, UA: NEGATIVE mg/dL
Hgb urine dipstick: NEGATIVE
Ketones, ur: NEGATIVE mg/dL
Leukocytes,Ua: NEGATIVE
Nitrite: NEGATIVE
Protein, ur: NEGATIVE mg/dL
Specific Gravity, Urine: 1.006 (ref 1.005–1.030)
pH: 7 (ref 5.0–8.0)

## 2023-01-03 MED ORDER — METRONIDAZOLE 500 MG PO TABS
2000.0000 mg | ORAL_TABLET | Freq: Once | ORAL | Status: AC
  Administered 2023-01-03: 2000 mg via ORAL
  Filled 2023-01-03: qty 4

## 2023-01-03 MED ORDER — DOXYCYCLINE HYCLATE 100 MG PO CAPS: 100.0000 mg | ORAL_CAPSULE | Freq: Two times a day (BID) | ORAL | 0 refills | Status: DC

## 2023-01-03 MED ORDER — DOXYCYCLINE HYCLATE 100 MG PO TABS
100.0000 mg | ORAL_TABLET | Freq: Once | ORAL | Status: AC
  Administered 2023-01-03: 100 mg via ORAL
  Filled 2023-01-03: qty 1

## 2023-01-03 NOTE — Discharge Instructions (Addendum)
As discussed, visit to the emergency department today overall reassuring.  We have treated you in the emergency department for trichomonas.  We will continue antibiotic therapy in the outpatient setting for chlamydia.  Recommend not partaking in sexual intercourse until you have finished your antibiotics.  Please do not hesitate to return to emergency department for worrisome signs and symptoms we discussed become apparent.

## 2023-01-03 NOTE — ED Provider Notes (Signed)
[place] EMERGENCY DEPARTMENT AT [facility] Provider Note   CSN: [id number] Arrival date & time: 01/03/23  1905     History  Chief Complaint  Patient presents with   Exposure to STD    Todd Frost is a 22 y.o. male.   Exposure to STD   22 year old male presents emergency department with concern for STD exposure.  States that his sexual partner tested positive for chlamydia as well as trichomonas and he is requesting treatment for both.  Patient reports foul smell in groin region but denies any dysuria, hematuria, penile discharge, rash.  Denies any fever, abdominal pain.  No significant pertinent past medical history.  Home Medications Prior to Admission medications   Medication Sig Start Date End Date Taking? Authorizing Provider  doxycycline (VIBRAMYCIN) 100 MG capsule Take 1 capsule (100 mg total) by mouth 2 (two) times daily. 01/04/23  Yes Sherian Maroon A, PA  divalproex (DEPAKOTE SPRINKLES) 125 MG capsule Take 1 capsule (125 mg total) by mouth 4 (four) times daily for 30 days. 06/01/18 07/01/18  Dartha Lodge, PA-C      Allergies    Penicillin g    Review of Systems   Review of Systems  All other systems reviewed and are negative.   Physical Exam Updated Vital Signs BP (!) 142/78   Pulse 75   Temp 98 F (36.7 C)   Resp 20   Ht 6\' 4"  (1.93 m)   Wt 77.1 kg   SpO2 99%   BMI 20.69 kg/m  Physical Exam Vitals and nursing note reviewed.  Constitutional:      General: He is not in acute distress.    Appearance: He is well-developed.  HENT:     Head: Normocephalic and atraumatic.  Eyes:     Conjunctiva/sclera: Conjunctivae normal.  Cardiovascular:     Rate and Rhythm: Normal rate and regular rhythm.     Heart sounds: No murmur heard. Pulmonary:     Effort: Pulmonary effort is normal. No respiratory distress.     Breath sounds: Normal breath sounds.  Abdominal:     Palpations: Abdomen is soft.     Tenderness: There is no abdominal tenderness.   Musculoskeletal:        General: No swelling.     Cervical back: Neck supple.  Skin:    General: Skin is warm and dry.     Capillary Refill: Capillary refill takes less than 2 seconds.  Neurological:     Mental Status: He is alert.  Psychiatric:        Mood and Affect: Mood normal.     ED Results / Procedures / Treatments   Labs (all labs ordered are listed, but only abnormal results are displayed) Labs Reviewed  URINALYSIS, ROUTINE W REFLEX MICROSCOPIC - Abnormal; Notable for the following components:      Result Value   Color, Urine COLORLESS (*)    All other components within normal limits  GC/CHLAMYDIA PROBE AMP ([place]) NOT AT [facility]    EKG None  Radiology No results found.  Procedures Procedures    Medications Ordered in ED Medications  metroNIDAZOLE (FLAGYL) tablet 2,000 mg (2,000 mg Oral Given 01/03/23 2005)  doxycycline (VIBRA-TABS) tablet 100 mg (100 mg Oral Given 01/03/23 2005)    ED Course/ Medical Decision Making/ A&P  Medical Decision Making Amount and/or Complexity of Data Reviewed Labs: ordered.  Risk Prescription drug management.   This patient presents to the ED for concern of STD exposure, this involves an extensive number of treatment options, and is a complaint that carries with it a high risk of complications and morbidity.  The differential diagnosis includes gonorrhea, chlamydia, HIV, syphilis, trichomoniasis, other   Co morbidities that complicate the patient evaluation  See HPI   Additional history obtained:  Additional history obtained from EMR External records from outside source obtained and reviewed including hospital records   Lab Tests:  I Ordered, and personally interpreted labs.  The pertinent results include: UA without abnormality.  GC/chlamydia pending   Imaging Studies ordered:  N/a   Cardiac Monitoring: / EKG:  The patient was maintained on a cardiac monitor.  I  personally viewed and interpreted the cardiac monitored which showed an underlying rhythm of: Sinus rhythm   Consultations Obtained:  N/a   Problem List / ED Course / Critical interventions / Medication management  STD exposure I ordered medication including Flagyl, doxycycline   Reevaluation of the patient after these medicines showed that the patient improved I have reviewed the patients home medicines and have made adjustments as needed   Social Determinants of Health:  Tobacco use.  Denies illicit drug use.   Test / Admission - Considered:  STD exposure Vitals signs significant for hypertension blood pressure 142/78. Otherwise within normal range and stable throughout visit. Laboratory studies significant for: See above 22 year old male presents to the emergency department with concern for STD exposure.  Patient largely asymptomatic at this time.  Requesting treatment for both chlamydia as well as trichomonas exposure.  Patient states that the partner did not test positive for anything else.  Denying testing for HIV, syphilis, trichomonas.  States that he would just give urine for GC/committee.  Will treat empirically for side pathologies.  Recommend sexual abstinence until antibiotics have been completed.  Treatment plan discussed at length with patient and he acknowledged understanding was agreeable to said plan.  Patient overall well-appearing, afebrile in no acute distress. Worrisome signs and symptoms were discussed with the patient, and the patient acknowledged understanding to return to the ED if noticed. Patient was stable upon discharge.          Final Clinical Impression(s) / ED Diagnoses Final diagnoses:  STD exposure    Rx / DC Orders ED Discharge Orders          Ordered    doxycycline (VIBRAMYCIN) 100 MG capsule  2 times daily        01/03/23 2021              Peter Garter, [place] 01/03/23 2026    Rozelle Logan, DO 01/04/23 0023

## 2023-01-03 NOTE — ED Triage Notes (Addendum)
Pt reports a sexual partner advised him of testing positive for trichomonas and chlamydia x 2 weeks ago, pt reports a foul smell but denies discharge, pt refuses HIV testing

## 2023-01-03 NOTE — ED Notes (Signed)
Discharge paperwork given and verbally understood. 

## 2023-01-04 LAB — GC/CHLAMYDIA PROBE AMP (~~LOC~~) NOT AT ARMC
Chlamydia: NEGATIVE
Comment: NEGATIVE
Comment: NORMAL
Neisseria Gonorrhea: NEGATIVE

## 2024-03-13 ENCOUNTER — Emergency Department (HOSPITAL_BASED_OUTPATIENT_CLINIC_OR_DEPARTMENT_OTHER)
Admission: EM | Admit: 2024-03-13 | Discharge: 2024-03-13 | Disposition: A | Discharge status: Home / Self Care | Attending: Emergency Medicine | Admitting: Emergency Medicine

## 2024-03-13 ENCOUNTER — Encounter (HOSPITAL_BASED_OUTPATIENT_CLINIC_OR_DEPARTMENT_OTHER): Payer: Self-pay | Admitting: Emergency Medicine

## 2024-03-13 ENCOUNTER — Other Ambulatory Visit: Payer: Self-pay

## 2024-03-13 DIAGNOSIS — Z202 Contact with and (suspected) exposure to infections with a predominantly sexual mode of transmission: Secondary | ICD-10-CM | POA: Insufficient documentation

## 2024-03-13 DIAGNOSIS — A64 Unspecified sexually transmitted disease: Secondary | ICD-10-CM | POA: Insufficient documentation

## 2024-03-13 DIAGNOSIS — R369 Urethral discharge, unspecified: Secondary | ICD-10-CM | POA: Diagnosis present

## 2024-03-13 LAB — URINALYSIS, W/ REFLEX TO CULTURE (INFECTION SUSPECTED)
Bacteria, UA: NONE SEEN
Bilirubin Urine: NEGATIVE
Glucose, UA: NEGATIVE mg/dL
Hgb urine dipstick: NEGATIVE
Ketones, ur: NEGATIVE mg/dL
Leukocytes,Ua: NEGATIVE
Nitrite: NEGATIVE
Protein, ur: NEGATIVE mg/dL
Specific Gravity, Urine: 1.017 (ref 1.005–1.030)
pH: 7.5 (ref 5.0–8.0)

## 2024-03-13 LAB — HIV ANTIBODY (ROUTINE TESTING W REFLEX): HIV Screen 4th Generation wRfx: NONREACTIVE

## 2024-03-13 MED ORDER — METRONIDAZOLE 500 MG PO TABS: 500.0000 mg | ORAL_TABLET | Freq: Two times a day (BID) | ORAL | 0 refills | Status: AC

## 2024-03-13 MED ORDER — CEFTRIAXONE SODIUM 500 MG IJ SOLR
500.0000 mg | Freq: Once | INTRAMUSCULAR | Status: AC
  Administered 2024-03-13: 500 mg via INTRAMUSCULAR
  Filled 2024-03-13: qty 500

## 2024-03-13 MED ORDER — DOXYCYCLINE HYCLATE 100 MG PO TABS
100.0000 mg | ORAL_TABLET | Freq: Once | ORAL | Status: AC
  Administered 2024-03-13: 100 mg via ORAL
  Filled 2024-03-13: qty 1

## 2024-03-13 MED ORDER — DOXYCYCLINE HYCLATE 100 MG PO CAPS: 100.0000 mg | ORAL_CAPSULE | Freq: Two times a day (BID) | ORAL | 0 refills | Status: AC

## 2024-03-13 MED ORDER — LIDOCAINE HCL (PF) 1 % IJ SOLN
5.0000 mL | Freq: Once | INTRAMUSCULAR | Status: AC
  Administered 2024-03-13: 5 mL
  Filled 2024-03-13: qty 5

## 2024-03-13 NOTE — ED Provider Notes (Signed)
 " [place] EMERGENCY DEPARTMENT AT [facility] Provider Note   CSN: [id number] Arrival date & time: 03/13/24  1344     Patient presents with: Exposure to STD   Todd Frost is a 24 y.o. male who presents to the ED today with primary concern of penile discharge with foul odor, recent sexual contact with male partner who recently advised that she is being treated for chlamydia infection as well.  He presents for testing as well as management of likely chlamydial infection.    Exposure to STD       Prior to Admission medications  Medication Sig Start Date End Date Taking? Authorizing Provider  doxycycline  (VIBRAMYCIN ) 100 MG capsule Take 1 capsule (100 mg total) by mouth 2 (two) times daily. 03/13/24  Yes Myriam Dorn BROCKS, PA  divalproex  (DEPAKOTE  SPRINKLES) 125 MG capsule Take 1 capsule (125 mg total) by mouth 4 (four) times daily for 30 days. 06/01/18 07/01/18  Alva Larraine FALCON, PA-C    Allergies: Penicillin g    Review of Systems  Genitourinary:  Positive for dysuria and penile discharge.  All other systems reviewed and are negative.   Updated Vital Signs BP 138/76   Pulse 98   Temp 97.6 F (36.4 C) (Oral)   Resp 20   SpO2 100%   Physical Exam Vitals and nursing note reviewed.  Constitutional:      General: He is not in acute distress.    Appearance: He is well-developed.  HENT:     Head: Normocephalic and atraumatic.  Eyes:     Conjunctiva/sclera: Conjunctivae normal.  Cardiovascular:     Rate and Rhythm: Normal rate and regular rhythm.     Heart sounds: No murmur heard. Pulmonary:     Effort: Pulmonary effort is normal. No respiratory distress.     Breath sounds: Normal breath sounds.  Abdominal:     Palpations: Abdomen is soft.     Tenderness: There is no abdominal tenderness.  Musculoskeletal:        General: No swelling.     Cervical back: Neck supple.  Skin:    General: Skin is warm and dry.     Capillary Refill: Capillary refill takes  less than 2 seconds.  Neurological:     Mental Status: He is alert.  Psychiatric:        Mood and Affect: Mood normal.     (all labs ordered are listed, but only abnormal results are displayed) Labs Reviewed  URINALYSIS, W/ REFLEX TO CULTURE (INFECTION SUSPECTED)  SYPHILIS: RPR W/REFLEX TO RPR TITER AND TREPONEMAL ANTIBODIES, TRADITIONAL SCREENING AND DIAGNOSIS ALGORITHM  HIV ANTIBODY (ROUTINE TESTING W REFLEX)  GC/CHLAMYDIA PROBE AMP ([place]) NOT AT [facility]    EKG: None  Radiology: No results found.   Procedures   Medications Ordered in the ED  cefTRIAXone  (ROCEPHIN ) injection 500 mg (has no administration in time range)  lidocaine  (PF) (XYLOCAINE ) 1 % injection 5 mL (has no administration in time range)  doxycycline  (VIBRA -TABS) tablet 100 mg (has no administration in time range)                                    Medical Decision Making Amount and/or Complexity of Data Reviewed Labs: ordered.  Risk Prescription drug management.   Given the recent history as well as his current complaints, I is consistent with likely community-acquired infection however can also consider possible HIV as well  as syphilis due to previously endorsed sexual contact.  He is agreeable to testing for same, so testing has been ordered for gonorrhea and chlamydia as well as for syphilis and HIV.  Will prophylactically manage the likely chlamydial infection with an injection of ceftriaxone  as well as oral doxycycline .  Course of doxycycline  to be continued in the outpatient setting.  Discussion had with the patient regarding prevention measures as well as careful return precautions, also advised to keep tabs on his MyChart for test results as they become available.  He understands agrees has no further concerns at this time and thus we will discharge patient with outpatient follow-up to primary care for continued management.     Final diagnoses:  STI (sexually transmitted infection)    ED  Discharge Orders          Ordered    doxycycline  (VIBRAMYCIN ) 100 MG capsule  2 times daily        03/13/24 1528               Myriam Dorn BROCKS, [place] 03/13/24 1537    Emil Share, DO 03/13/24 1539  "

## 2024-03-13 NOTE — ED Provider Triage Note (Signed)
 Emergency Medicine Provider Triage Evaluation Note  Todd Frost , a 24 y.o. male  was evaluated in triage.  Pt complains of dysuria and penile discharge, denies penis pain, denies testicular pain.  Reports that he recently had a sexual encounter with a male partner who has since reportedly told him that she was positive for chlamydia and trichomonas.  Patient does want empiric antibiotics for GC/C, does not use barrier contraception.  Review of Systems  Positive: Per above Negative: Per above  Physical Exam  BP 138/76   Pulse 98   Temp 97.6 F (36.4 C) (Oral)   Resp 20   SpO2 100%  Gen:   Awake, no distress   Resp:  Normal effort  MSK:   Moves extremities without difficulty   Medical Decision Making  Medically screening exam initiated at 2:11 PM.  Appropriate orders placed.  Todd Frost was informed that the remainder of the evaluation will be completed by another provider, this initial triage assessment does not replace that evaluation, and the importance of remaining in the ED until their evaluation is complete.    Todd Jerilynn RAMAN, MD 03/13/24 1414

## 2024-03-13 NOTE — ED Triage Notes (Signed)
 Reports foul odor, discharge, and burning x 1 week. Exposed to chlamydia.

## 2024-03-14 LAB — SYPHILIS: RPR W/REFLEX TO RPR TITER AND TREPONEMAL ANTIBODIES, TRADITIONAL SCREENING AND DIAGNOSIS ALGORITHM: RPR Ser Ql: NONREACTIVE

## 2024-03-16 LAB — GC/CHLAMYDIA PROBE AMP (~~LOC~~) NOT AT ARMC
Chlamydia: NEGATIVE
Comment: NEGATIVE
Comment: NORMAL
Neisseria Gonorrhea: NEGATIVE
